# Patient Record
Sex: Female | Born: 1970
Health system: Southern US, Community
[De-identification: ages and names within clinical notes are randomized; demographics above are authoritative.]

## PROBLEM LIST (undated history)

## (undated) HISTORY — PX: OTHER SURGICAL HISTORY: SHX169

---

## 2001-07-18 ENCOUNTER — Emergency Department (HOSPITAL_COMMUNITY): Admission: EM | Admit: 2001-07-18 | Discharge: 2001-07-18 | Payer: Self-pay | Admitting: Emergency Medicine

## 2001-07-18 ENCOUNTER — Encounter: Payer: Self-pay | Admitting: Emergency Medicine

## 2002-10-24 ENCOUNTER — Emergency Department (HOSPITAL_COMMUNITY): Admission: EM | Admit: 2002-10-24 | Discharge: 2002-10-24 | Payer: Self-pay | Admitting: Emergency Medicine

## 2002-10-24 ENCOUNTER — Encounter: Payer: Self-pay | Admitting: Emergency Medicine

## 2002-12-03 ENCOUNTER — Encounter: Payer: Self-pay | Admitting: Family Medicine

## 2002-12-03 ENCOUNTER — Encounter: Admission: RE | Admit: 2002-12-03 | Discharge: 2002-12-03 | Payer: Self-pay | Admitting: Family Medicine

## 2002-12-17 ENCOUNTER — Encounter: Admission: RE | Admit: 2002-12-17 | Discharge: 2002-12-17 | Payer: Self-pay | Admitting: Family Medicine

## 2002-12-17 ENCOUNTER — Encounter: Payer: Self-pay | Admitting: Family Medicine

## 2003-06-17 ENCOUNTER — Encounter: Admission: RE | Admit: 2003-06-17 | Discharge: 2003-06-17 | Payer: Self-pay | Admitting: Family Medicine

## 2003-11-20 ENCOUNTER — Other Ambulatory Visit: Admission: RE | Admit: 2003-11-20 | Discharge: 2003-11-20 | Payer: Self-pay | Admitting: Obstetrics and Gynecology

## 2015-04-07 ENCOUNTER — Other Ambulatory Visit: Payer: Self-pay | Admitting: Internal Medicine

## 2015-04-07 DIAGNOSIS — E049 Nontoxic goiter, unspecified: Secondary | ICD-10-CM

## 2015-04-21 ENCOUNTER — Encounter (HOSPITAL_COMMUNITY)
Admission: RE | Admit: 2015-04-21 | Discharge: 2015-04-21 | Disposition: A | Discharge status: Home / Self Care | Payer: BLUE CROSS/BLUE SHIELD | Source: Ambulatory Visit | Attending: Internal Medicine | Admitting: Internal Medicine

## 2015-04-21 DIAGNOSIS — E049 Nontoxic goiter, unspecified: Secondary | ICD-10-CM | POA: Insufficient documentation

## 2015-04-22 ENCOUNTER — Encounter (HOSPITAL_COMMUNITY)
Admission: RE | Admit: 2015-04-22 | Discharge: 2015-04-22 | Disposition: A | Discharge status: Home / Self Care | Payer: BLUE CROSS/BLUE SHIELD | Source: Ambulatory Visit | Attending: Internal Medicine | Admitting: Internal Medicine

## 2015-04-22 DIAGNOSIS — E049 Nontoxic goiter, unspecified: Secondary | ICD-10-CM | POA: Diagnosis not present

## 2015-04-22 MED ORDER — SODIUM PERTECHNETATE TC 99M INJECTION
10.0000 | Freq: Once | INTRAVENOUS | Status: AC | PRN
Start: 2015-04-22 — End: 2015-04-22
  Administered 2015-04-22: 10 via INTRAVENOUS

## 2015-04-22 MED ORDER — SODIUM IODIDE I 131 CAPSULE
12.0000 | Freq: Once | INTRAVENOUS | Status: AC | PRN
  Administered 2015-04-21: 12 via ORAL

## 2016-09-13 ENCOUNTER — Other Ambulatory Visit: Payer: Self-pay | Admitting: Family Medicine

## 2016-09-13 DIAGNOSIS — E010 Iodine-deficiency related diffuse (endemic) goiter: Secondary | ICD-10-CM

## 2016-09-16 ENCOUNTER — Ambulatory Visit
Admission: RE | Admit: 2016-09-16 | Discharge: 2016-09-16 | Disposition: A | Discharge status: Home / Self Care | Payer: 59 | Source: Ambulatory Visit | Attending: Family Medicine | Admitting: Family Medicine

## 2016-09-16 DIAGNOSIS — E010 Iodine-deficiency related diffuse (endemic) goiter: Secondary | ICD-10-CM

## 2016-12-29 ENCOUNTER — Emergency Department (HOSPITAL_BASED_OUTPATIENT_CLINIC_OR_DEPARTMENT_OTHER)
Admission: EM | Admit: 2016-12-29 | Discharge: 2016-12-29 | Disposition: A | Discharge status: Home / Self Care | Payer: 59 | Attending: Emergency Medicine | Admitting: Emergency Medicine

## 2016-12-29 ENCOUNTER — Encounter (HOSPITAL_BASED_OUTPATIENT_CLINIC_OR_DEPARTMENT_OTHER): Payer: Self-pay | Admitting: Emergency Medicine

## 2016-12-29 DIAGNOSIS — Z87891 Personal history of nicotine dependence: Secondary | ICD-10-CM | POA: Diagnosis not present

## 2016-12-29 DIAGNOSIS — M545 Low back pain, unspecified: Secondary | ICD-10-CM

## 2016-12-29 MED ORDER — TRAMADOL HCL 50 MG PO TABS: 50.0000 mg | ORAL_TABLET | Freq: Four times a day (QID) | ORAL | 0 refills | Status: AC | PRN

## 2016-12-29 MED ORDER — CYCLOBENZAPRINE HCL 10 MG PO TABS: 10.0000 mg | ORAL_TABLET | Freq: Two times a day (BID) | ORAL | 0 refills | Status: AC | PRN

## 2016-12-29 MED FILL — CYCLOBENZAPRINE 10 MG TAB: 10 | 10 days supply | Qty: 20 | Fill #0
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
## 2016-12-29 NOTE — ED Notes (Signed)
ED Provider at bedside. 

## 2016-12-29 NOTE — ED Triage Notes (Signed)
Back pain states  Pulled back yesterday

## 2016-12-29 NOTE — Discharge Instructions (Signed)
Please read attached information. If you experience any new or worsening signs or symptoms please return to the emergency room for evaluation. Please follow-up with your primary care provider or specialist as discussed. Please use medication prescribed only as directed and discontinue taking if you have any concerning signs or symptoms.   °

## 2017-10-06 DIAGNOSIS — R030 Elevated blood-pressure reading, without diagnosis of hypertension: Secondary | ICD-10-CM | POA: Insufficient documentation

## 2017-10-06 DIAGNOSIS — E669 Obesity, unspecified: Secondary | ICD-10-CM | POA: Insufficient documentation

## 2017-10-09 DIAGNOSIS — E0590 Thyrotoxicosis, unspecified without thyrotoxic crisis or storm: Secondary | ICD-10-CM | POA: Insufficient documentation

## 2018-01-18 DIAGNOSIS — N951 Menopausal and female climacteric states: Secondary | ICD-10-CM | POA: Insufficient documentation

## 2018-01-18 DIAGNOSIS — E0520 Thyrotoxicosis with toxic multinodular goiter without thyrotoxic crisis or storm: Secondary | ICD-10-CM | POA: Insufficient documentation

## 2019-07-11 DIAGNOSIS — E7800 Pure hypercholesterolemia, unspecified: Secondary | ICD-10-CM | POA: Insufficient documentation

## 2020-05-28 ENCOUNTER — Other Ambulatory Visit: Payer: Self-pay

## 2020-05-28 ENCOUNTER — Emergency Department (HOSPITAL_COMMUNITY): Payer: 59

## 2020-05-28 ENCOUNTER — Emergency Department (HOSPITAL_COMMUNITY)
Admission: EM | Admit: 2020-05-28 | Discharge: 2020-05-28 | Disposition: A | Discharge status: Home / Self Care | Payer: 59 | Attending: Emergency Medicine | Admitting: Emergency Medicine

## 2020-05-28 ENCOUNTER — Encounter (HOSPITAL_COMMUNITY): Payer: Self-pay | Admitting: Emergency Medicine

## 2020-05-28 DIAGNOSIS — Z87891 Personal history of nicotine dependence: Secondary | ICD-10-CM | POA: Insufficient documentation

## 2020-05-28 DIAGNOSIS — R519 Headache, unspecified: Secondary | ICD-10-CM | POA: Diagnosis present

## 2020-05-28 MED ORDER — DIPHENHYDRAMINE HCL 25 MG PO CAPS
25.0000 mg | ORAL_CAPSULE | Freq: Once | ORAL | Status: AC
  Administered 2020-05-28: 25 mg via ORAL
  Filled 2020-05-28: qty 1

## 2020-05-28 MED ORDER — METHOCARBAMOL 500 MG PO TABS: 500.0000 mg | ORAL_TABLET | Freq: Two times a day (BID) | ORAL | 0 refills | Status: AC

## 2020-05-28 MED ORDER — ACETAMINOPHEN 500 MG PO TABS
1000.0000 mg | ORAL_TABLET | Freq: Once | ORAL | Status: AC
  Administered 2020-05-28: 1000 mg via ORAL
  Filled 2020-05-28: qty 2

## 2020-05-28 MED ORDER — METOCLOPRAMIDE HCL 10 MG PO TABS
10.0000 mg | ORAL_TABLET | Freq: Once | ORAL | Status: AC
  Administered 2020-05-28: 10 mg via ORAL
  Filled 2020-05-28: qty 1

## 2020-05-28 MED ORDER — DEXAMETHASONE SODIUM PHOSPHATE 4 MG/ML IJ SOLN
4.0000 mg | Freq: Once | INTRAMUSCULAR | Status: AC
  Administered 2020-05-28: 4 mg via INTRAMUSCULAR
  Filled 2020-05-28: qty 1

## 2020-05-28 NOTE — ED Triage Notes (Signed)
Patient arrives to ED with complaints of a right sided headache (behind the ear) since yesterday. Pt states the pain is constant and goes from sharp to dull.

## 2020-05-28 NOTE — ED Notes (Signed)
Patient verbalizes understanding of discharge instructions. Opportunity for questioning and answers were provided. Armband removed by staff, pt discharged from ED.  

## 2020-05-28 NOTE — Discharge Instructions (Addendum)
Please follow-up care doctor.  Do not have 1 please follow-up with the [facility] he did not have insurance, if you have insurance please follow-up with any primary care doctor who is covering returns.  Please drink plenty of water use Tylenol ibuprofen as described below.  Please use Tylenol or ibuprofen for pain.  You may use 600 mg ibuprofen every 6 hours or 1000 mg of Tylenol every 6 hours.  You may choose to alternate between the 2.  This would be most effective.  Not to exceed 4 g of Tylenol within 24 hours.  Not to exceed 3200 mg ibuprofen 24 hours.  Please use the muscle relaxer prescribed you for neck pain as well this may help if this is a cervicogenic headache with headache related to neck pain.  Your CT scan was negative for any bleeding in the brain.  Overall is very reassuring along with your reassuring history.  The important neck step is follow-up.

## 2020-05-28 NOTE — ED Provider Notes (Signed)
[facility] EMERGENCY DEPARTMENT Provider Note   CSN: [id number] Arrival date & time: 05/28/20  1409     History Chief Complaint  Patient presents with  . Headache    Jennifer Barnett is a 49 y.o. female.  HPI Patient is 48 year old female with a history of brain AVM status post repair presented today with severe headache for the past 2 days.  She states that her problems include quickly has been constant.  She states is occasionally sharp occasionally dull no significant aggravating or mitigating factors.  She has tried Tylenol at home with relief.  She states she has some light sensitivity and some neck pain which she states she has had with prior headaches however states this is worse when she is had that she can remember.  No nausea, vomiting, chest pain shortness of breath.  No vision changes. She denies any other associated symptoms.  Aggravating or mitigating factors besides light making it somewhat worse.  Denies any syncope mental status or focal weakness.    History reviewed. No pertinent past medical history.  There are no problems to display for this patient.   Past Surgical History:  Procedure Laterality Date  . lumpctomy       OB History   No obstetric history on file.     History reviewed. No pertinent family history.  Social History   Tobacco Use  . Smoking status: Former Games developer  . Smokeless tobacco: Never Used  Substance Use Topics  . Alcohol use: Yes    Comment: occ  . Drug use: Not on file    Home Medications Prior to Admission medications   Medication Sig Start Date End Date Taking? Authorizing Provider  cyclobenzaprine (FLEXERIL) 10 MG tablet Take 1 tablet (10 mg total) by mouth 2 (two) times daily as needed for muscle spasms. 12/29/16   Hedges, Tinnie Gens, PA-C  methocarbamol (ROBAXIN) 500 MG tablet Take 1 tablet (500 mg total) by mouth 2 (two) times daily. 05/28/20   Gailen Shelter, PA  traMADol (ULTRAM) 50 MG tablet Take  1 tablet (50 mg total) by mouth every 6 (six) hours as needed. 12/29/16   Eyvonne Mechanic, PA-C    Allergies    Patient has no known allergies.  Review of Systems   Review of Systems  Constitutional: Negative for fever.  HENT: Negative for congestion.   Respiratory: Negative for shortness of breath.   Cardiovascular: Negative for chest pain.  Gastrointestinal: Negative for abdominal distention.  Neurological: Positive for headaches. Negative for dizziness.    Physical Exam Updated Vital Signs BP (!) 151/96 (BP Location: Left Arm)   Pulse 75   Temp 98.1 F (36.7 C) (Oral)   Resp 15   Ht 5\' 9"  (1.753 m)   Wt 102.1 kg   SpO2 99%   BMI 33.23 kg/m   Physical Exam Vitals and nursing note reviewed.  Constitutional:      General: She is not in acute distress. HENT:     Head: Normocephalic and atraumatic.     Nose: Nose normal.  Eyes:     General: No scleral icterus. Cardiovascular:     Rate and Rhythm: Normal rate and regular rhythm.     Pulses: Normal pulses.     Heart sounds: Normal heart sounds.  Pulmonary:     Effort: Pulmonary effort is normal. No respiratory distress.     Breath sounds: No wheezing.  Abdominal:     Palpations: Abdomen is soft.  Tenderness: There is no abdominal tenderness.  Musculoskeletal:     Cervical back: Normal range of motion.     Right lower leg: No edema.     Left lower leg: No edema.  Skin:    General: Skin is warm and dry.     Capillary Refill: Capillary refill takes less than 2 seconds.  Neurological:     Mental Status: She is alert. Mental status is at baseline.     Comments: Alert and oriented to self, place, time and event.   Speech is fluent, clear without dysarthria or dysphasia.   Strength 5/5 in upper/lower extremities  Sensation intact in upper/lower extremities   Normal gait.  Negative Romberg. No pronator drift.  Normal finger-to-nose and feet tapping.  CN I not tested  CN II grossly intact visual fields  bilaterally. Did not visualize posterior eye.   CN III, IV, VI PERRLA and EOMs intact bilaterally  CN V Intact sensation to sharp and light touch to the face  CN VII facial movements symmetric  CN VIII not tested  CN IX, X no uvula deviation, symmetric rise of soft palate  CN XI 5/5 SCM and trapezius strength bilaterally  CN XII Midline tongue protrusion, symmetric L/R movements   Psychiatric:        Mood and Affect: Mood normal.        Behavior: Behavior normal.     ED Results / Procedures / Treatments   Labs (all labs ordered are listed, but only abnormal results are displayed) Labs Reviewed - No data to display  EKG None  Radiology CT Head Wo Contrast  Result Date: 05/28/2020 CLINICAL DATA:  Headaches EXAM: CT HEAD WITHOUT CONTRAST TECHNIQUE: Contiguous axial images were obtained from the base of the skull through the vertex without intravenous contrast. COMPARISON:  None. FINDINGS: Brain: No evidence of acute infarction, hemorrhage, hydrocephalus, extra-axial collection or mass lesion/mass effect. Mild encephalomalacia changes are noted in the posterior right cerebellum consistent with prior surgical intervention. Vascular: No hyperdense vessel or unexpected calcification. Skull: Posterior right occipital craniotomy Sinuses/Orbits: No acute finding. Other: None. IMPRESSION: Postsurgical changes without acute abnormality. Electronically Signed   By: Alcide Clever M.D.   On: 05/28/2020 17:58    Procedures Procedures (including critical care time)  Medications Ordered in ED Medications  metoCLOPramide (REGLAN) tablet 10 mg (10 mg Oral Given 05/28/20 1635)  diphenhydrAMINE (BENADRYL) capsule 25 mg (25 mg Oral Given 05/28/20 1635)  dexamethasone (DECADRON) injection 4 mg (4 mg Intramuscular Given 05/28/20 1635)  acetaminophen (TYLENOL) tablet 1,000 mg (1,000 mg Oral Given 05/28/20 1635)    ED Course  I have reviewed the triage vital signs and the nursing notes.  Pertinent  labs & imaging results that were available during my care of the patient were reviewed by me and considered in my medical decision making (see chart for details).    MDM Rules/Calculators/A&P                          Patient is 49 year old female with history of AVM in the brain status post repair.  She has had severe headache for the past 2 days which is much worse than her usual headches.  She has had no trauma and she is neurologically intact I have overall low suspicion for hemorrhage however given his history will obtain CT imaging of head to rule this out.  This was negative for any acute intracranial hemorrhage.  Patient given Reglan, Benadryl,  IM injection of Decadron and Tylenol and will follow up with PCP she is also given return precautions.  Low suspicion for the below cause of emergent headache.  Emergent considerations for headache include subarachnoid hemorrhage, meningitis, temporal arteritis, glaucoma, cerebral ischemia, carotid/vertebral dissection, intracranial tumor, Venous sinus thrombosis, carbon monoxide poisoning, acute or chronic subdural hemorrhage.    Other considerations include: Migraine, Cluster headache, Hypertension, Caffeine, alcohol, or drug withdrawal, Pseudotumor cerebri, Arteriovenous malformation, Head injury, Neurocysticercosis, Post-lumbar puncture, Preeclampsia, Tension headache, Sinusitis, Cervical arthritis, Refractive error causing strain, Dental abscess, Otitis media, Temporomandibular joint syndrome, Depression, Somatoform disorder (eg, somatization) Trigeminal neuralgia, Glossopharyngeal neuralgia.  I discussed my reasoning with patient she is agreeable to plan to discharge with follow-up with her PCP she states she can see them in the next couple days.  Final Clinical Impression(s) / ED Diagnoses Final diagnoses:  Bad headache    Rx / DC Orders ED Discharge Orders         Ordered    methocarbamol (ROBAXIN) 500 MG tablet  2 times daily         05/28/20 1812           Solon Augusta [place], [place] 05/28/20 2030    Jacalyn Lefevre, MD 05/31/20 1519

## 2020-06-15 DIAGNOSIS — H9313 Tinnitus, bilateral: Secondary | ICD-10-CM | POA: Insufficient documentation

## 2020-07-10 DIAGNOSIS — S82091A Other fracture of right patella, initial encounter for closed fracture: Secondary | ICD-10-CM | POA: Insufficient documentation

## 2020-07-10 DIAGNOSIS — R32 Unspecified urinary incontinence: Secondary | ICD-10-CM | POA: Insufficient documentation

## 2021-03-29 ENCOUNTER — Other Ambulatory Visit: Payer: Self-pay

## 2021-03-29 ENCOUNTER — Emergency Department (HOSPITAL_COMMUNITY)
Admission: EM | Admit: 2021-03-29 | Discharge: 2021-03-30 | Disposition: A | Discharge status: Home / Self Care | Payer: 59 | Attending: Emergency Medicine | Admitting: Emergency Medicine

## 2021-03-29 ENCOUNTER — Encounter (HOSPITAL_COMMUNITY): Payer: Self-pay | Admitting: Emergency Medicine

## 2021-03-29 DIAGNOSIS — R079 Chest pain, unspecified: Secondary | ICD-10-CM | POA: Insufficient documentation

## 2021-03-29 DIAGNOSIS — R0602 Shortness of breath: Secondary | ICD-10-CM | POA: Insufficient documentation

## 2021-03-29 DIAGNOSIS — Z5321 Procedure and treatment not carried out due to patient leaving prior to being seen by health care provider: Secondary | ICD-10-CM | POA: Insufficient documentation

## 2021-03-29 NOTE — ED Triage Notes (Addendum)
Patient arrived with EMS from home reports right chest pain with mild SOB this evening , no emesis or diaphoresis , she received ASA 324 mg and 1 NTG sl with relief. Patient added GERD and indigestion this evening .

## 2021-03-29 NOTE — ED Provider Notes (Addendum)
Emergency Medicine Provider Triage Evaluation Note  Jennifer Barnett , a 50 y.o. female  was evaluated in triage.  Pt complains of right-sided chest pain onset approximately 1 hour prior to arrival.  Patient reports she was sitting outside talking to a friend, smoked several cigarettes and then began to feel worse.  Reports she had right-sided chest pain that radiated to the right shoulder, right arm and into the right neck.  Patient then had a witnessed syncopal episode but did not fall or hit her head as she was sitting in a chair when it happened.  EMS was called and found her alert and oriented.  Patient reports she has had indigestion for several hours.  Denies alcohol usage, marijuana usage or other drugs.  No previous history of symptoms like this.  She does not still menstruate.  Pt given ASA and Nitro x2 with improvement of pain to a 2/10.   Review of Systems  Positive: Chest pain, shortness of breath, syncope Negative: Nausea, vomiting, diarrhea  Physical Exam  BP 137/77 (BP Location: Right Arm)   Pulse 67   Temp 98.3 F (36.8 C)   Resp 17   Ht 5\' 9"  (1.753 m)   Wt 115 kg   SpO2 99%   BMI 37.44 kg/m  Gen:   Awake, no distress   Resp:  Normal effort, clear and equal breath sounds MSK:   Moves extremities without difficulty  Other:  Regular rate and rhythm, no oral trauma  Medical Decision Making  Medically screening exam initiated at 11:38 PM.  Appropriate orders placed.  was informed that the remainder of the evaluation will be completed by another provider, this initial triage assessment does not replace that evaluation, and the importance of remaining in the ED until their evaluation is complete.  Chest pain and syncope.  Work-up initiated.   Landy Dunnavant, Renie Ora 03/29/21 2346    Mario Voong, 03/31/21, PA-C 03/29/21 2347    03/31/21, MD 03/30/21 [phone]

## 2021-03-30 ENCOUNTER — Emergency Department (HOSPITAL_COMMUNITY): Payer: 59

## 2021-03-30 DIAGNOSIS — R079 Chest pain, unspecified: Secondary | ICD-10-CM | POA: Diagnosis not present

## 2021-03-30 LAB — CBC WITH DIFFERENTIAL/PLATELET
Abs Immature Granulocytes: 0.01 10*3/uL (ref 0.00–0.07)
Basophils Absolute: 0 10*3/uL (ref 0.0–0.1)
Basophils Relative: 1 %
Eosinophils Absolute: 0.2 10*3/uL (ref 0.0–0.5)
Eosinophils Relative: 4 %
HCT: 43.1 % (ref 36.0–46.0)
Hemoglobin: 13.7 g/dL (ref 12.0–15.0)
Immature Granulocytes: 0 %
Lymphocytes Relative: 33 %
Lymphs Abs: 2 10*3/uL (ref 0.7–4.0)
MCH: 27.6 pg (ref 26.0–34.0)
MCHC: 31.8 g/dL (ref 30.0–36.0)
MCV: 86.9 fL (ref 80.0–100.0)
Monocytes Absolute: 0.6 10*3/uL (ref 0.1–1.0)
Monocytes Relative: 10 %
Neutro Abs: 3.2 10*3/uL (ref 1.7–7.7)
Neutrophils Relative %: 52 %
Platelets: 201 10*3/uL (ref 150–400)
RBC: 4.96 MIL/uL (ref 3.87–5.11)
RDW: 14.5 % (ref 11.5–15.5)
WBC: 6 10*3/uL (ref 4.0–10.5)
nRBC: 0 % (ref 0.0–0.2)

## 2021-03-30 LAB — COMPREHENSIVE METABOLIC PANEL
ALT: 20 U/L (ref 0–44)
AST: 19 U/L (ref 15–41)
Albumin: 3.7 g/dL (ref 3.5–5.0)
Alkaline Phosphatase: 79 U/L (ref 38–126)
Anion gap: 7 (ref 5–15)
BUN: 11 mg/dL (ref 6–20)
CO2: 28 mmol/L (ref 22–32)
Calcium: 9.4 mg/dL (ref 8.9–10.3)
Chloride: 107 mmol/L (ref 98–111)
Creatinine, Ser: 0.99 mg/dL (ref 0.44–1.00)
GFR, Estimated: >60 mL/min (ref >60)
Glucose, Bld: 91 mg/dL (ref 70–99)
Potassium: 3.5 mmol/L (ref 3.5–5.1)
Sodium: 142 mmol/L (ref 135–145)
Total Bilirubin: 0.2 mg/dL — ABNORMAL LOW (ref 0.3–1.2)
Total Protein: 7.3 g/dL (ref 6.5–8.1)

## 2021-03-30 LAB — TROPONIN I (HIGH SENSITIVITY)
Troponin I (High Sensitivity): <2 ng/L (ref <18)
Troponin I (High Sensitivity): <2 ng/L (ref <18)

## 2021-03-30 LAB — LIPASE, BLOOD: Lipase: 46 U/L (ref 11–51)

## 2021-03-30 NOTE — ED Notes (Signed)
Called pt for triage twice, no response yet

## 2021-03-30 NOTE — ED Notes (Signed)
EMS IV removed by this NT

## 2021-03-30 NOTE — ED Notes (Signed)
Pt states she has to work and will follow up with primary care with her results

## 2021-04-19 ENCOUNTER — Ambulatory Visit (INDEPENDENT_AMBULATORY_CARE_PROVIDER_SITE_OTHER): Payer: 59 | Admitting: Podiatry

## 2021-04-19 ENCOUNTER — Other Ambulatory Visit: Payer: Self-pay

## 2021-04-19 ENCOUNTER — Ambulatory Visit (INDEPENDENT_AMBULATORY_CARE_PROVIDER_SITE_OTHER): Payer: 59

## 2021-04-19 DIAGNOSIS — M795 Residual foreign body in soft tissue: Secondary | ICD-10-CM

## 2021-04-19 DIAGNOSIS — S9032XA Contusion of left foot, initial encounter: Secondary | ICD-10-CM

## 2021-04-20 NOTE — Progress Notes (Signed)
   HPI: 49 y.o. female presenting today as a new patient for evaluation of pain and tenderness to the left forefoot.  Patient states that about 2-3 months ago she stepped on a piece of glass.  She was able to remove it all.  She actually went to an urgent care and unfortunately they were unable to remove the piece of glass as well and she was referred here.  Patient states that she has tried removing it in the past however it makes the lesion bleed and pushes the glass further in.  She presents for further treatment and evaluation  No past medical history on file.   Physical Exam: General: The patient is alert and oriented x3 in no acute distress.  Dermatology: Skin is warm, dry and supple bilateral lower extremities.  There is an entry point lesion to the plantar aspect of the left forefoot.  There is some callus around the area as well.  No purulence.  No drainage.  No erythema.  Vascular: Palpable pedal pulses bilaterally. No edema or erythema noted. Capillary refill within normal limits.  Neurological: Epicritic and protective threshold grossly intact bilaterally.   Musculoskeletal Exam: No pedal deformities.  Pain with palpation around the entry point of the foreign body glass  Radiographic Exam:  Normal osseous mineralization. Joint spaces preserved. No fracture/dislocation/boney destruction.  Unable to clearly identify the piece of glass on radiographic exam  Assessment: 1.  Foreign body, glass, left forefoot   Plan of Care:  1. Patient evaluated. X-Rays reviewed.  2.  Light debridement of the area was performed in an attempt to remove the glass.  Unfortunately it is deeper than I was hoping.  Today I discussed with the patient taking her to the surgery center under anesthesia to remove the foreign body.  I explained all the details and possible complications of the procedure.  No guarantees were expressed or implied.  The patient agrees and would like to go to the surgery center for  removal foreign body left foot. 3.  Authorization for surgery was initiated today.  Surgery will consist of removal of foreign body, glass, left foot 4.  Return to clinic 1 week postop  *HR Works from home      Felecia Shelling, DPM [facility]  Dr. Felecia Shelling, DPM    [address] [place], [place] [address]                Office [phone]  Fax [phone]

## 2021-04-21 ENCOUNTER — Telehealth: Payer: Self-pay | Admitting: Urology

## 2021-04-21 NOTE — Telephone Encounter (Signed)
DOS - 05/13/21  EXC FOREIGN BODY LEFT --- 28020   [employer] EFFECTIVE DATE - 08/08/20   PLAN DEDUCTIBLE - Member's plan does not have an In-Network individual plan deductible. OUT OF POCKET - Member's plan does not have an In-Network individual out-of-pocket maximum. COINSURANCE - 20% COPAY -  $0.00   SPOKE WITH BERNIE G WITH [employer] AND SHE STATED THAT FOR CPT CODE 54492 NO PRIOR AUTH IS REQUIRED.   REF # [id number]

## 2021-04-26 ENCOUNTER — Ambulatory Visit: Payer: 59 | Admitting: Podiatry

## 2021-05-13 ENCOUNTER — Encounter: Payer: Self-pay | Admitting: Podiatry

## 2021-05-13 ENCOUNTER — Other Ambulatory Visit: Payer: Self-pay | Admitting: Podiatry

## 2021-05-13 DIAGNOSIS — M795 Residual foreign body in soft tissue: Secondary | ICD-10-CM

## 2021-05-13 MED ORDER — DOXYCYCLINE HYCLATE 100 MG PO TABS: 100.0000 mg | ORAL_TABLET | Freq: Two times a day (BID) | ORAL | 0 refills | Status: AC

## 2021-05-13 MED ORDER — HYDROCODONE-ACETAMINOPHEN 5-325 MG PO TABS: 1.0000 | ORAL_TABLET | ORAL | 0 refills | Status: AC | PRN

## 2021-05-13 NOTE — Progress Notes (Signed)
PRN postop 

## 2021-05-19 ENCOUNTER — Other Ambulatory Visit: Payer: Self-pay

## 2021-05-19 ENCOUNTER — Ambulatory Visit (INDEPENDENT_AMBULATORY_CARE_PROVIDER_SITE_OTHER): Payer: 59

## 2021-05-19 ENCOUNTER — Ambulatory Visit (INDEPENDENT_AMBULATORY_CARE_PROVIDER_SITE_OTHER): Payer: 59 | Admitting: Podiatry

## 2021-05-19 DIAGNOSIS — Z9889 Other specified postprocedural states: Secondary | ICD-10-CM

## 2021-05-19 NOTE — Progress Notes (Signed)
   Subjective:  Patient presents today status post removal foreign body left forefoot. DOS: 05/13/2021. Patient states that she is feeling well.  She has kept the dressings clean and dry since surgery.  No new complaints at this time  No past medical history on file.    Objective/Physical Exam Neurovascular status intact.  Skin incisions appear to be well coapted with sutures intact. No sign of infectious process noted. No dehiscence. No active bleeding noted.  Negative for any edema noted to the surgical extremity.   Assessment: 1. s/p removal foreign body glass left foot. DOS: 05/13/2021   Plan of Care:  1. Patient was evaluated. 2.  Dressings changed 3.  I explained to the patient that once the incision heals if she continues to have the same symptoms that she did prior to surgery we will need to order an MRI to better identify the foreign body or glass since this was unsuccessful exploration during surgery 4.  Return to clinic 2 weeks for suture removal   Felecia Shelling, DPM [facility]  Dr. Felecia Shelling, DPM    [address] [place], [place] [address]                Office [phone]  Fax [phone]

## 2021-05-26 ENCOUNTER — Encounter: Payer: 59 | Admitting: Podiatry

## 2021-06-02 ENCOUNTER — Ambulatory Visit (INDEPENDENT_AMBULATORY_CARE_PROVIDER_SITE_OTHER): Payer: 59 | Admitting: Podiatry

## 2021-06-02 ENCOUNTER — Other Ambulatory Visit: Payer: Self-pay

## 2021-06-02 DIAGNOSIS — Z9889 Other specified postprocedural states: Secondary | ICD-10-CM

## 2021-06-02 DIAGNOSIS — M795 Residual foreign body in soft tissue: Secondary | ICD-10-CM

## 2021-06-02 NOTE — Progress Notes (Signed)
   Subjective:  Patient presents today status post removal foreign body left forefoot. DOS: 05/13/2021.  Patient has been washing and showering and getting foot wet.  She states that she tried to wear a pair of heels but had pain.  She presents for further treatment evaluation  No past medical history on file.    Objective/Physical Exam Neurovascular status intact.  Skin incisions appear to be well coapted with sutures intact. No sign of infectious process noted. No dehiscence. No active bleeding noted.  Negative for any edema noted to the surgical extremity.   Assessment: 1. s/p removal foreign body glass left foot. DOS: 05/13/2021  Plan of Care:  1. Patient was evaluated. 2.  Sutures removed today.  Light debridement of the callus tissue around the incision site was performed using a 312 scalpel 3.  Recommend daily foot lotion.  Foot lotion was provided for today 4.  Recommend good supportive shoes and sneakers.  Return to clinic in 4 weeks.  If the patient continues to have pain and is adamant that there is a piece of glass in her foot we will order MRI  Felecia Shelling, DPM [facility]  Dr. Felecia Shelling, DPM    [address] [place], [place] [address]                Office [phone]  Fax [phone]

## 2021-06-14 ENCOUNTER — Encounter: Payer: 59 | Admitting: Podiatry

## 2021-07-07 ENCOUNTER — Other Ambulatory Visit: Payer: Self-pay

## 2021-07-07 ENCOUNTER — Ambulatory Visit (INDEPENDENT_AMBULATORY_CARE_PROVIDER_SITE_OTHER): Payer: 59 | Admitting: Podiatry

## 2021-07-07 DIAGNOSIS — Z9889 Other specified postprocedural states: Secondary | ICD-10-CM

## 2021-07-07 DIAGNOSIS — M795 Residual foreign body in soft tissue: Secondary | ICD-10-CM

## 2021-07-07 NOTE — Progress Notes (Signed)
   Subjective:  Patient presents today status post removal foreign body left forefoot. DOS: 05/13/2021.  Patient continues to have pain and tenderness to the left forefoot and is adamant that there is a piece of glass or foreign body within the forefoot.  She says that she is unable to walk without pain.  There has been no improvement over the past 4 weeks.  No past medical history on file.    Objective/Physical Exam Neurovascular status intact.  Skin incision to the plantar forefoot appears to be well coapted and healed. No sign of infectious process noted. No dehiscence. No active bleeding noted.  Negative for any edema noted to the surgical extremity.  There is a hyperkeratotic area along the plantar aspect of the foot at the incision site with associated tenderness to palpation.   Assessment: 1. s/p removal foreign body glass left foot. DOS: 05/13/2021  Plan of Care:  1. Patient was evaluated. 2.  The patient has not had no improvement and is certain that there is a piece of foreign body within the foot.  Today were going to order MRI left foot to rule out any foreign body 3.  Return to clinic after MRI to discuss further treatment options both surgical and conservative 4.  Offloading felt pads were provided for the patient to offload the area and alleviate some of the patient's pressure and pain  Felecia Shelling, DPM [facility]  Dr. Felecia Shelling, DPM    [address] [place], [place] [address]                Office [phone]  Fax [phone]

## 2021-07-23 ENCOUNTER — Other Ambulatory Visit: Payer: Self-pay

## 2021-07-23 ENCOUNTER — Ambulatory Visit
Admission: RE | Admit: 2021-07-23 | Discharge: 2021-07-23 | Disposition: A | Discharge status: Home / Self Care | Payer: 59 | Source: Ambulatory Visit | Attending: Podiatry | Admitting: Podiatry

## 2021-07-23 DIAGNOSIS — M795 Residual foreign body in soft tissue: Secondary | ICD-10-CM

## 2021-09-08 ENCOUNTER — Ambulatory Visit (INDEPENDENT_AMBULATORY_CARE_PROVIDER_SITE_OTHER): Payer: 59 | Admitting: Podiatry

## 2021-09-08 ENCOUNTER — Other Ambulatory Visit: Payer: Self-pay

## 2021-09-08 DIAGNOSIS — B070 Plantar wart: Secondary | ICD-10-CM

## 2021-09-08 NOTE — Progress Notes (Signed)
° °  HPI: 51 y.o. female presenting today for follow-up evaluation of left forefoot pain.  Patient initially had reported that she had stepped on a piece of glass to the left forefoot.  She underwent removal foreign body to the left foot however there was no foreign body identified during surgery.  DOS: 05/13/2021.  Patient continues to have some pain and tenderness associated to the left foot.  Last visit on 07/07/2021 MRI was ordered to rule out any foreign body.  She presents today to discuss the results and discuss additional treatment options  No past medical history on file.  Past Surgical History:  Procedure Laterality Date   lumpctomy     No Known Allergies      Physical Exam: General: The patient is alert and oriented x3 in no acute distress.  Dermatology: Hyperkeratotic skin lesion noted to the plantar aspect of the left forefoot plantar fourth MTP joint with a central nucleated core.  Upon debridement and close evaluation there may be some very small pinpoint bleeding to the soft tissue area.  It is hard to clearly visualize and identify whether this is an intractable plantar keratosis versus plantar verruca.  It measures approximately 0.7 cm in diameter.  Vascular: Palpable pedal pulses bilaterally. Capillary refill within normal limits.  Negative for any significant edema or erythema.  No clinical concern for vascular compromise  Neurological: Light touch and protective threshold grossly intact  Musculoskeletal Exam: No pedal deformities noted.  Associated tenderness to palpation at the skin lesion noted above  MR foot left wo contrast 07/23/2021 IMPRESSION: Mild, ill-defined soft tissue edema signal along the plantar lateral forefoot, corresponding to the palpable marker. There is no evidence of foreign body on MRI.   Small fluid collection along the plantar aspect of the second digit MTP joint measuring 1.0 x 0.4 x 0.9 cm, consistent with adventitial bursitis.   Minimal  intermetatarsal bursal fluid in the first, second, and third web spaces. No neuroma.   Assessment: 1.  Benign skin lesion/possible porokeratosis vs verruca left foot  Plan of Care:  1. Patient evaluated.  MRI reviewed.  Findings were negative for foreign body.  The remaining findings of the MRI were noncontributory other than some ill-defined soft tissue edema around the skin lesion. 2.  Excisional debridement of the hyperkeratotic callus tissue was performed using a 312 scalpel without incident or bleeding. 3.  For now we are going to treat the patient topically which should be effective whether this is IPK versus verruca.  Salicylic acid applied.  Recommend salicylic acid daily x4 weeks as tolerated under occlusion with a Band-Aid 4.  Return to clinic in 4 weeks     Edrick Kins, DPM [facility]  Dr. Edrick Kins, DPM    [address], [place] [address]                Office [phone]  Fax [phone]

## 2021-10-04 ENCOUNTER — Other Ambulatory Visit: Payer: Self-pay

## 2021-10-04 ENCOUNTER — Ambulatory Visit (INDEPENDENT_AMBULATORY_CARE_PROVIDER_SITE_OTHER): Payer: 59 | Admitting: Podiatry

## 2021-10-04 DIAGNOSIS — B070 Plantar wart: Secondary | ICD-10-CM

## 2021-10-06 ENCOUNTER — Ambulatory Visit: Payer: 59 | Admitting: Podiatry

## 2021-10-11 ENCOUNTER — Ambulatory Visit (INDEPENDENT_AMBULATORY_CARE_PROVIDER_SITE_OTHER): Payer: 59 | Admitting: Podiatry

## 2021-10-11 ENCOUNTER — Ambulatory Visit: Payer: 59 | Admitting: Podiatry

## 2021-10-11 ENCOUNTER — Other Ambulatory Visit: Payer: Self-pay

## 2021-10-11 DIAGNOSIS — B070 Plantar wart: Secondary | ICD-10-CM | POA: Diagnosis not present

## 2021-10-11 NOTE — Progress Notes (Signed)
° °  HPI: 51 y.o. female presenting today for follow-up evaluation of pain and tenderness associated to the left forefoot.  Patient initially underwent a removal foreign body procedure to the left foot ever there was no foreign body identified during surgery.  DOS: 05/13/2021.  MRI was performed 07/07/2021 to rule out any foreign body.  She stated that she does have a history of stepping on a piece of glass to the left forefoot.  MRI was negative for foreign body. Since that time and since last visit we have been applying salicylic acid daily to the forefoot lesion.  Patient states that she is still using the salicylic acid as instructed and it is peeling off.  There is some associated discomfort.  She presents for further treatment and evaluation  No past medical history on file.  Past Surgical History:  Procedure Laterality Date   lumpctomy      No Known Allergies   Physical Exam: General: The patient is alert and oriented x3 in no acute distress.  Dermatology: Skin is warm, dry and supple bilateral lower extremities.  Macerated skin with hyperkeratosis noted to the left plantar forefoot.  Upon debridement there is some slight pinpoint bleeding which may indicate possible plantar verruca  Vascular: Palpable pedal pulses bilaterally. Capillary refill within normal limits.  Negative for any significant edema or erythema  Neurological: Light touch and protective threshold grossly intact  Musculoskeletal Exam: No pedal deformities noted  Assessment: 1.  Possible plantar verruca left forefoot vs intractable plantar keratoma   Plan of Care:  1. Patient evaluated. 2.  Light debridement of the area was performed using a combination of a 312 scalpel and tissue nipper.  During this procedure pinpoint bleeding was noted positive Auspitz sign consistent with possible findings of plantar verruca 3.  Recommended application of Cantharone however the patient does not want the Cantharone applied today.   She would like to have this done next week.  She says that she has an event this weekend and would not like anything applied. 4.  Return to clinic in 1 week for [facility] application  *Has any event this Friday, 10/08/2021      Felecia Shelling, DPM [facility]  Dr. Felecia Shelling, DPM    [address] [place], [place] [address]                Office [phone]  Fax [phone]

## 2021-10-11 NOTE — Progress Notes (Signed)
? ?  HPI: 51 y.o. female presenting today for follow-up evaluation of pain and tenderness associated to the left forefoot.  Patient initially underwent a removal foreign body procedure to the left foot ever there was no foreign body identified during surgery.  DOS: 05/13/2021.  MRI was performed 07/07/2021 to rule out any foreign body.  She stated that she does have a history of stepping on a piece of glass to the left forefoot.  MRI was negative for foreign body. ? ?Last visit the area was lightly debrided and the patient states that she did feel some relief over the past week.  She presents today to have application of Cantharone to the lesion to see if this can eradicate the symptomatic soft tissue lesion of the forefoot ? ?No past medical history on file. ? ?Past Surgical History:  ?Procedure Laterality Date  ? lumpctomy    ? ? ?No Known Allergies ?  ?Physical Exam: ?General: The patient is alert and oriented x3 in no acute distress. ? ?Dermatology: Skin is warm, dry and supple bilateral lower extremities.  There continues to be some slight hyperkeratosis noted to the left plantar forefoot with a central nucleated core.   ? ?Vascular: Palpable pedal pulses bilaterally. Capillary refill within normal limits.  Negative for any significant edema or erythema ? ?Neurological: Light touch and protective threshold grossly intact ? ?Musculoskeletal Exam: No pedal deformities noted ? ?Assessment: ?1.  Possible plantar verruca left forefoot vs intractable plantar keratoma ? ? ?Plan of Care:  ?1. Patient evaluated. ?2.  Light debridement of the area was performed using a combination of a 312 scalpel and tissue nipper.   ?3.  Cantharone applied with a light dressing.  Post care instructions provided ?4.  Return to clinic 2 weeks ? ?  ?  ?Felecia Shelling, DPM ?[facility] ? ?Dr. Felecia Shelling, DPM  ?  ?[address]                                        ?[place], [place] [address]                ?Office [phone]  ?Fax [phone] ? ? ? ? ?

## 2021-10-25 ENCOUNTER — Ambulatory Visit: Payer: 59 | Admitting: Podiatry

## 2022-03-09 IMAGING — MR MR FOOT*L* W/O CM
5 series · 40 of 40 positions shown · non-contrast
Comparison: Left foot radiograph 05/20/2021

CLINICAL DATA: Foot trauma, penetrating, neg xray (Age >= 6y)

EXAM:
MRI OF THE LEFT FOOT WITHOUT CONTRAST
TECHNIQUE: Multiplanar, multisequence MR imaging of the left forefoot was
performed. No intravenous contrast was administered.

[Series 4: T1 · coronal · left · 3.0mm · 0.31mm/px · 12 of 40 slices shown (1 of 2)]
[im 1/40]
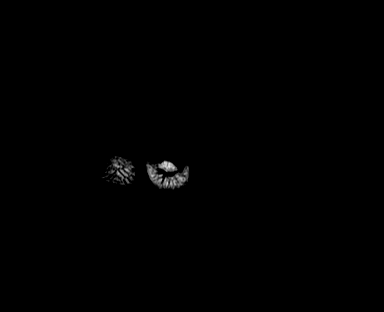
[im 4/40]
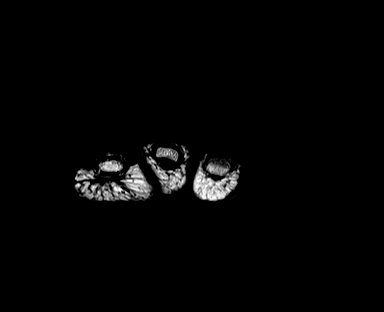
[im 8/40]
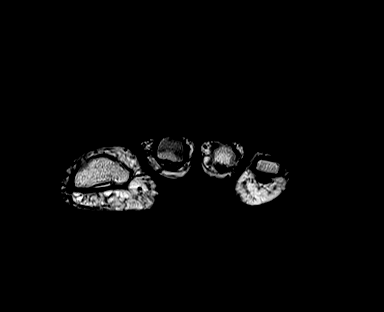
[im 11/40]
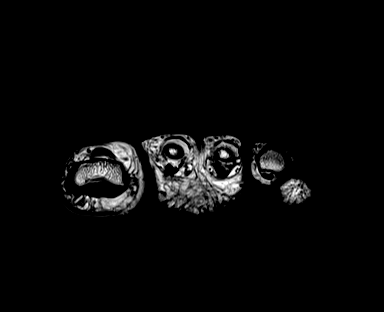
[im 15/40]
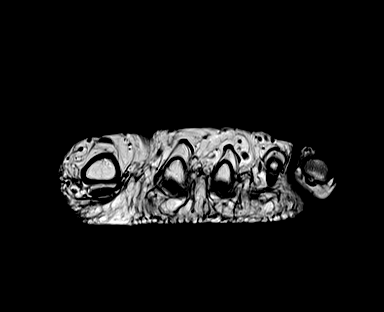
[im 18/40]
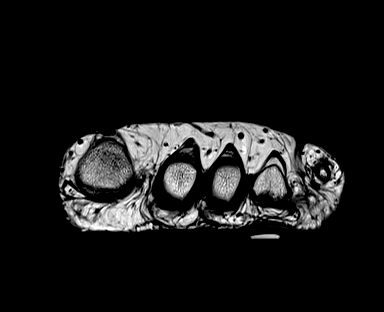
[im 22/40]
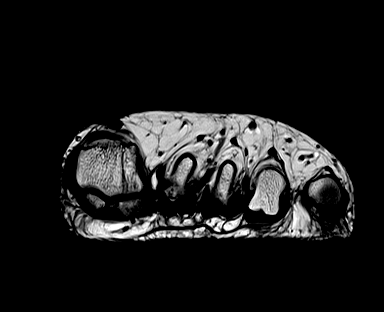
[im 25/40]
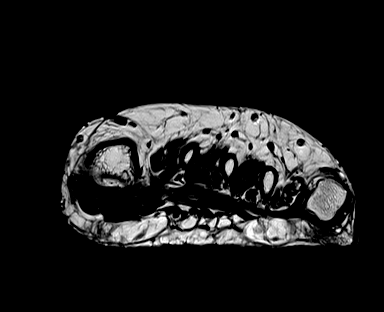
[im 29/40]
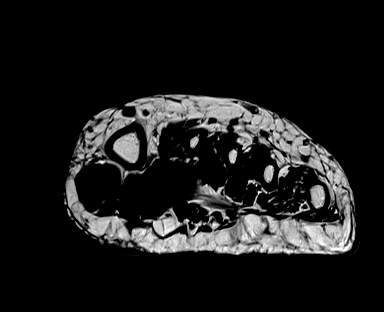
[im 32/40]
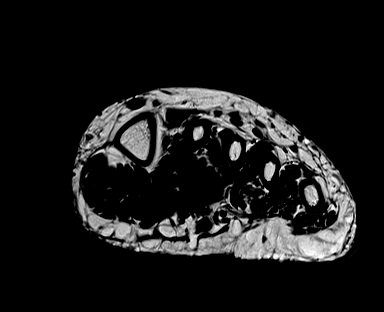
[im 36/40]
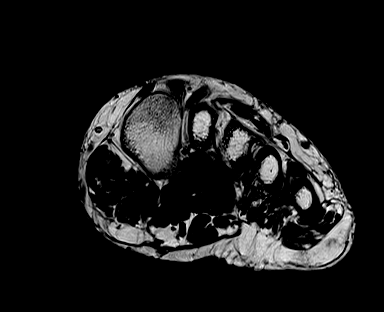
[im 40/40]
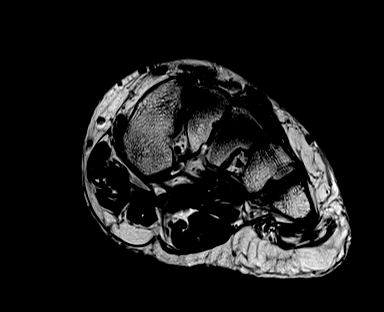

[Series 5: T2 fat-sat · coronal · left · 3.0mm · 0.31mm/px · 11 of 40 slices shown (1 of 2)]
[im 1/40]
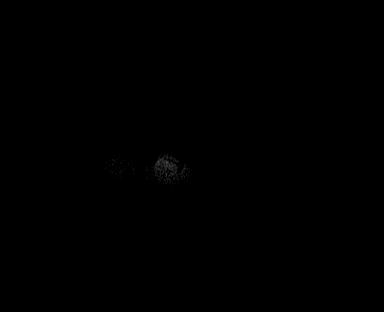
[im 4/40]
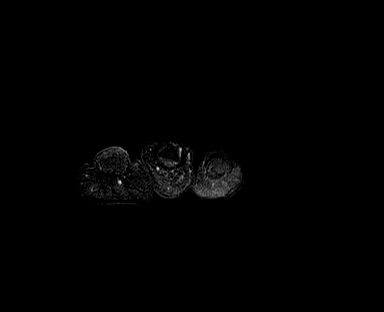
[im 8/40]
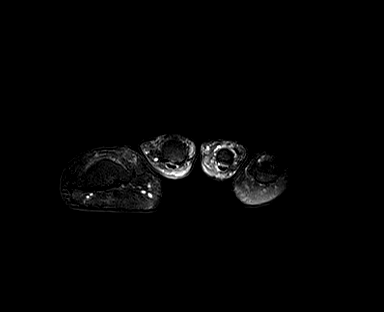
[im 12/40]
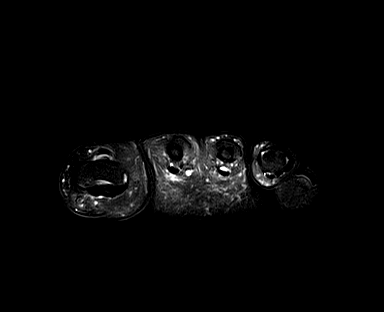
[im 16/40]
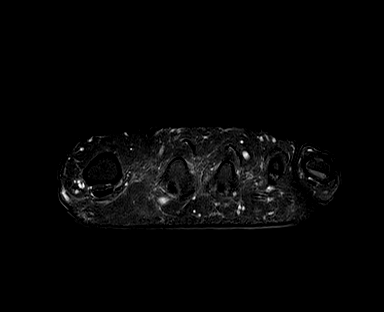
[im 20/40]
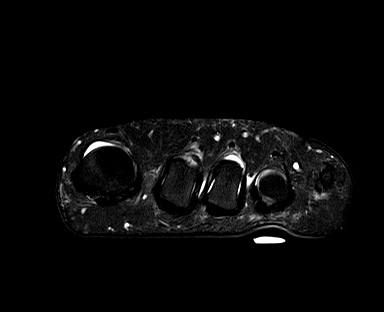
[im 24/40]
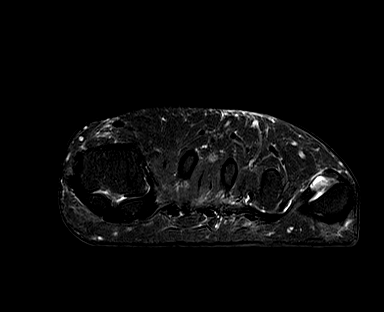
[im 28/40]
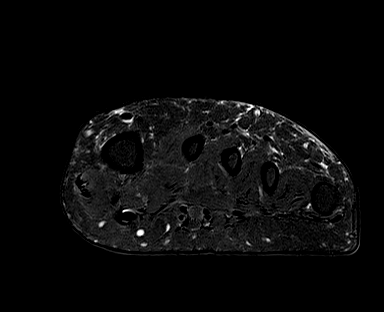
[im 32/40]
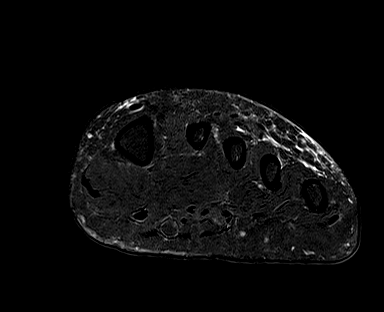
[im 36/40]
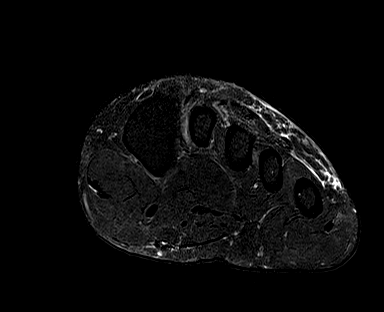
[im 40/40]
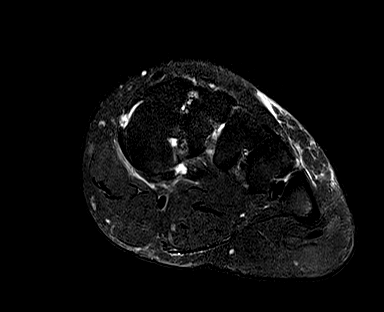

[Series 6: STIR · sagittal · left · 3.0mm · 0.70mm/px · 7 of 27 slices shown]
[im 1/27]
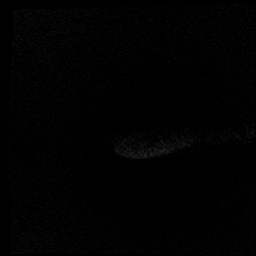
[im 5/27]
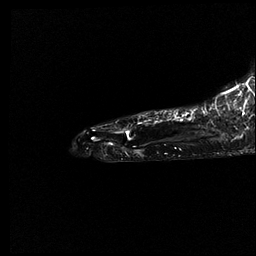
[im 9/27]
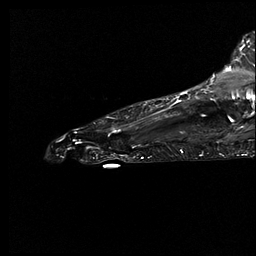
[im 14/27]
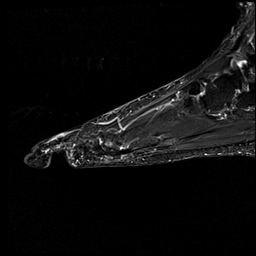
[im 18/27]
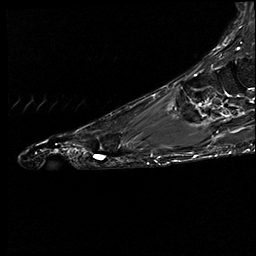
[im 22/27]
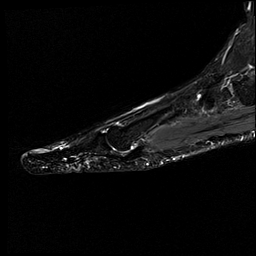
[im 27/27]
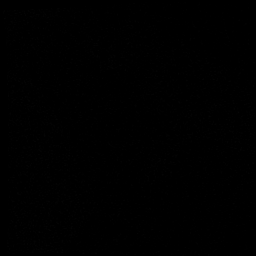

[Series 7: T1 · axial · left · 3.0mm · 0.47mm/px · z∈[-39,+35]mm · 5 of 20 slices shown (2 of 2)]
[im 1/20]
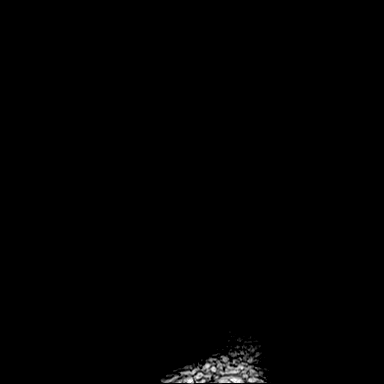
[im 5/20]
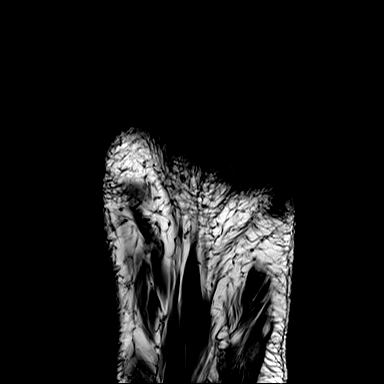
[im 10/20]
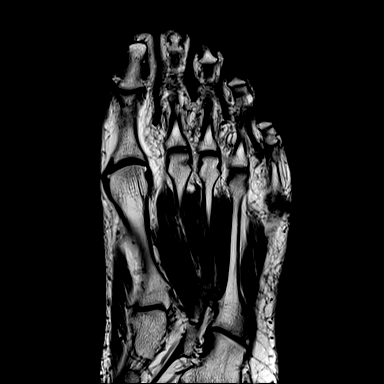
[im 15/20]
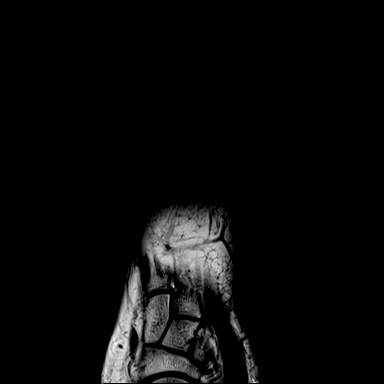
[im 20/20]
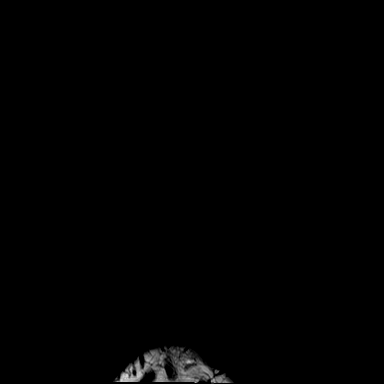

[Series 8: T2 fat-sat · axial · left · 3.0mm · 0.47mm/px · z∈[-39,+35]mm · 5 of 20 slices shown (2 of 2)]
[im 1/20]
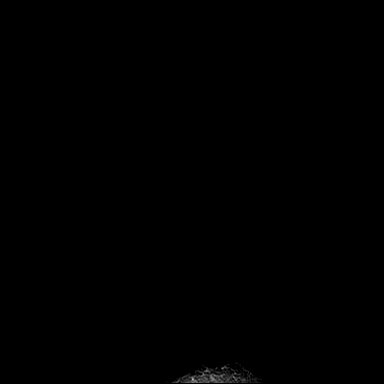
[im 5/20]
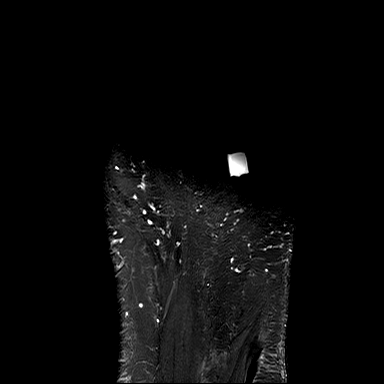
[im 10/20]
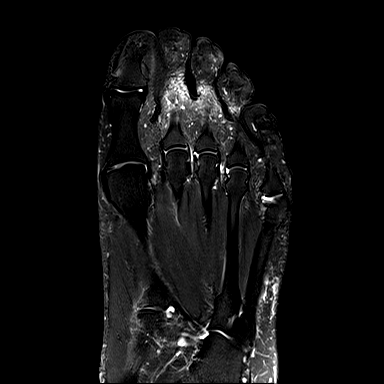
[im 15/20]
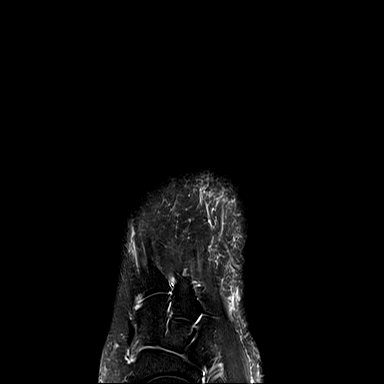
[im 20/20]
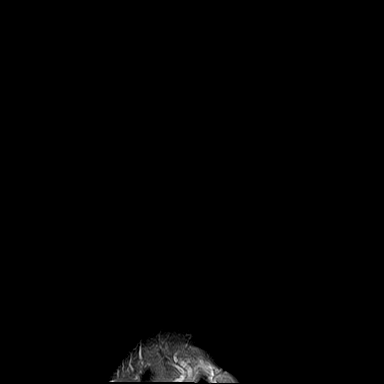

[40 of 40 positions shown; findings below may reference images not displayed]

FINDINGS: Bones/Joint/Cartilage

There is no evidence of acute fracture or dislocation. There is mild
diffuse interphalangeal joint osteoarthritis. No other marrow signal
abnormality.

Ligaments

Intact Lisfranc ligament. Intact MTP collateral ligaments. No
evidence of plantar plate tear.

Muscles and Tendons

No significant muscle edema or muscle atrophy. No acute tendon tear.

Soft tissues

There is a small fluid collection along the plantar aspect just
superficial to the second digit MTP joint measuring 1.0 x 0.4 by
cm (axial T2 image 18, sagittal stir image 13). Underlying the
palpable marker at the fourth MTP joint, there is ill-defined, mild
edema signal. Similar ill-defined mild edema signal at the plantar
aspect of the fifth MTP joint. There is no susceptibility artifact.
There is no evidence of intermetatarsal neuroma. There is minimal
intermetatarsal bursal fluid in the first, second, and third web
spaces.
IMPRESSION: Mild, ill-defined soft tissue edema signal along the plantar lateral
forefoot, corresponding to the palpable marker. There is no evidence
of foreign body on MRI.

Small fluid collection along the plantar aspect of the second digit
MTP joint measuring 1.0 x 0.4 x 0.9 cm, consistent with adventitial
bursitis.

Minimal intermetatarsal bursal fluid in the first, second, and third
web spaces. No neuroma.
